# Patient Record
Sex: Female | Born: 1965 | Race: Black or African American | Hispanic: No | Marital: Single | Smoking: Never smoker
Health system: Southern US, Community
[De-identification: ages and names within clinical notes are randomized; demographics above are authoritative.]

## PROBLEM LIST (undated history)

## (undated) DIAGNOSIS — E7800 Pure hypercholesterolemia, unspecified: Secondary | ICD-10-CM

## (undated) HISTORY — PX: LAPAROSCOPIC GASTRIC SLEEVE RESECTION: SHX5895

## (undated) HISTORY — PX: BUNIONECTOMY: SHX129

---

## 1998-01-08 ENCOUNTER — Other Ambulatory Visit: Admission: RE | Admit: 1998-01-08 | Discharge: 1998-01-08 | Payer: Self-pay | Admitting: Obstetrics and Gynecology

## 1999-04-08 ENCOUNTER — Other Ambulatory Visit: Admission: RE | Admit: 1999-04-08 | Discharge: 1999-04-08 | Payer: Self-pay | Admitting: Obstetrics and Gynecology

## 2004-04-06 ENCOUNTER — Other Ambulatory Visit: Admission: RE | Admit: 2004-04-06 | Discharge: 2004-04-06 | Payer: Self-pay | Admitting: Obstetrics and Gynecology

## 2005-05-19 ENCOUNTER — Other Ambulatory Visit: Admission: RE | Admit: 2005-05-19 | Discharge: 2005-05-19 | Payer: Self-pay | Admitting: Obstetrics and Gynecology

## 2009-05-22 ENCOUNTER — Encounter: Payer: Self-pay | Admitting: Cardiology

## 2009-05-23 ENCOUNTER — Encounter: Payer: Self-pay | Admitting: Cardiology

## 2009-06-10 ENCOUNTER — Ambulatory Visit: Payer: Self-pay | Admitting: Cardiology

## 2009-06-10 DIAGNOSIS — R002 Palpitations: Secondary | ICD-10-CM | POA: Insufficient documentation

## 2009-06-10 DIAGNOSIS — R011 Cardiac murmur, unspecified: Secondary | ICD-10-CM

## 2009-06-25 ENCOUNTER — Ambulatory Visit: Payer: Self-pay

## 2009-06-25 ENCOUNTER — Encounter: Payer: Self-pay | Admitting: Cardiology

## 2009-06-25 ENCOUNTER — Ambulatory Visit: Payer: Self-pay | Admitting: Cardiology

## 2009-06-25 ENCOUNTER — Ambulatory Visit (HOSPITAL_COMMUNITY): Admission: RE | Admit: 2009-06-25 | Discharge: 2009-06-25 | Payer: Self-pay | Admitting: Cardiology

## 2010-06-09 NOTE — Consult Note (Signed)
Summary: [facility]   Imported By: Marylou Mccoy 06/26/2009 15:38:04  _____________________________________________________________________  External Attachment:    Type:   Image     Comment:   External Document

## 2010-06-09 NOTE — Assessment & Plan Note (Signed)
Summary: np6/ palps, pt has bcbs./ gd   Visit Type:  Initial Consult Primary Provider:  Tarri Fuller, MD  CC:  pvc "s.  History of Present Illness: 45 yo with history of hyperlipidemia presents for evaluation of palpitations.  In early January, patient was exercising on the ellipitical at her gym.  She noted that her heart began to race while she was exercising and got up to 169 bpm.  Afterwards, she felt cold sweats, nausea, and lightheadedness. No syncope. Heart rate was still up at rest and it took a long time for it to go back to normal.  For the next week, she would have periodic episodes (several daily) of heart "fluttering," where her heart would feel fast and irregular.  Fluttering episodes were only momentary and would then go away.  She cut out caffeine (was drinking 1-2 cups coffee daily).  After a week, the symptoms seemed to totally resolve.  She has been back on the elliptical since that time with no symptoms.  No chest pain or significant exertional dyspnea.  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    ECG: NSR, normal Labs (1/11): TSH normal, LDL 127, HDL 63, creatinine 0.6  Current Medications (verified): 1)  Vitamin D 50,000 Iu .Marland Kitchen.. 1 Tab Weekly 2)  Clartin .... Prn  Allergies (verified): 1)  ! Erythromycin 2)  ! * Pcn Family  Past History:  Past Medical History: 1. Anxiety 2. 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  Does not smoke or use illicit drugs.  Occasional ETOH. Significant stress at work.   Review of Systems       All systems reviewed and negative except as per HPI.   Vital Signs:  Patient profile:   45 year old female Height:      63 inches Weight:      223 pounds BMI:     39.65 Pulse rate:   58 /  minute BP sitting:   121 / 77  (left arm) Cuff size:   large  Vitals Entered By: Burnett Kanaris, CNA (June 10, 2009 9:51 AM)  Physical Exam  General:  Well developed, well nourished, in no acute distress. Mildly obese.  Head:  normocephalic and atraumatic Nose:  no deformity, discharge, inflammation, or lesions Mouth:  Teeth, gums and palate normal. Oral mucosa normal. Neck:  Neck supple, no JVD. No masses, thyromegaly or abnormal cervical nodes. Lungs:  Clear bilaterally to auscultation and percussion. Heart:  Non-displaced PMI, chest non-tender; regular rate and rhythm, S1, S2 without rubs or gallops. There is a 1/6 systolic murmur at the right upper sternal border.  Carotid upstroke normal, no bruit.  Pedals normal pulses. No edema, no varicosities. Abdomen:  Bowel sounds positive; abdomen soft and non-tender without masses, organomegaly, or hernias noted. No hepatosplenomegaly. Msk:  Back normal, normal gait. Muscle strength and tone normal. Extremities:  No clubbing or cyanosis. Neurologic:  Alert and oriented x 3. Skin:  Intact without lesions or rashes. Psych:  Normal affect.   Impression & Recommendations:  Problem # 1:  PALPITATIONS (ICD-785.1) Patient is actually having minimal symptoms now.  She did have the sensation of heart fluttering prior: rapid and irregular beats momentarily.  I am unable to definitively explain the episode on the  elliptical unless she was significantly dehydrated when she exercised that day.  She has cut out caffeine.  TSH was normal.  My suspicion is that she was having premature beats as the cause of the fluttering.  I will set her up for a 48 hour holter monitor.  I do not think that she needs a stress test: no chest pain, young female with no family history of premature CAD.   Problem # 2:  MURMUR (ICD-785.2) Patient has a systolic murmur.  She says she was told that it was there in childhood. Will get an echocardiogram to make sure that the  heart is structurally normal.   Other Orders: EKG w/ Interpretation (93000) Echocardiogram (Echo) Holter Monitor (Holter Monitor)  Patient Instructions: 1)  Your physician has recommended that you wear a holter monitor.  Holter monitors are medical devices that record the heart's electrical activity. Doctors most often use these monitors to diagnose arrhythmias. Arrhythmias are problems with the speed or rhythm of the heartbeat. The monitor is a small, portable device. You can wear one while you do your normal daily activities. This is usually used to diagnose what is causing palpitations/syncope (passing out). 2)  Your physician has requested that you have an echocardiogram.  Echocardiography is a painless test that uses sound waves to create images of your heart. It provides your doctor with information about the size and shape of your heart and how well your heart's chambers and valves are working.  This procedure takes approximately one hour. There are no restrictions for this procedure. 3)  Your physician recommends that you schedule a follow-up appointment as needed with Dr. Marca Ancona

## 2010-06-09 NOTE — Procedures (Signed)
Summary: SUMMARY REPORT  SUMMARY REPORT   Imported By: Mirna Mires 07/09/2009 11:37:26  _____________________________________________________________________  External Attachment:    Type:   Image     Comment:   External Document

## 2012-01-03 ENCOUNTER — Emergency Department (HOSPITAL_COMMUNITY): Payer: 59

## 2012-01-03 ENCOUNTER — Emergency Department (HOSPITAL_COMMUNITY)
Admission: EM | Admit: 2012-01-03 | Discharge: 2012-01-03 | Disposition: A | Discharge status: Home / Self Care | Payer: 59 | Attending: Emergency Medicine | Admitting: Emergency Medicine

## 2012-01-03 ENCOUNTER — Encounter (HOSPITAL_COMMUNITY): Payer: Self-pay | Admitting: Emergency Medicine

## 2012-01-03 DIAGNOSIS — R209 Unspecified disturbances of skin sensation: Secondary | ICD-10-CM | POA: Insufficient documentation

## 2012-01-03 DIAGNOSIS — R51 Headache: Secondary | ICD-10-CM | POA: Insufficient documentation

## 2012-01-03 DIAGNOSIS — R079 Chest pain, unspecified: Secondary | ICD-10-CM | POA: Insufficient documentation

## 2012-01-03 DIAGNOSIS — R202 Paresthesia of skin: Secondary | ICD-10-CM

## 2012-01-03 DIAGNOSIS — I10 Essential (primary) hypertension: Secondary | ICD-10-CM | POA: Insufficient documentation

## 2012-01-03 DIAGNOSIS — R110 Nausea: Secondary | ICD-10-CM | POA: Insufficient documentation

## 2012-01-03 LAB — URINALYSIS, ROUTINE W REFLEX MICROSCOPIC
Glucose, UA: NEGATIVE mg/dL
Hgb urine dipstick: NEGATIVE
Leukocytes, UA: NEGATIVE
Specific Gravity, Urine: 1.006 (ref 1.005–1.030)
Urobilinogen, UA: 0.2 mg/dL (ref 0.0–1.0)

## 2012-01-03 LAB — CBC WITH DIFFERENTIAL/PLATELET
Eosinophils Absolute: 0 10*3/uL (ref 0.0–0.7)
Lymphocytes Relative: 52 % — ABNORMAL HIGH (ref 12–46)
Lymphs Abs: 3.8 10*3/uL (ref 0.7–4.0)
Neutrophils Relative %: 42 % — ABNORMAL LOW (ref 43–77)
Platelets: 267 10*3/uL (ref 150–400)
RBC: 4.6 MIL/uL (ref 3.87–5.11)
WBC: 7.2 10*3/uL (ref 4.0–10.5)

## 2012-01-03 LAB — BASIC METABOLIC PANEL
CO2: 26 mEq/L (ref 19–32)
GFR calc non Af Amer: >90 mL/min (ref >90)
Glucose, Bld: 85 mg/dL (ref 70–99)
Potassium: 3.6 mEq/L (ref 3.5–5.1)
Sodium: 140 mEq/L (ref 135–145)

## 2012-01-03 LAB — POCT I-STAT, CHEM 8
HCT: 43 % (ref 36.0–46.0)
Hemoglobin: 14.6 g/dL (ref 12.0–15.0)
Sodium: 141 mEq/L (ref 135–145)
TCO2: 26 mmol/L (ref 0–100)

## 2012-01-03 MED ORDER — HYDROCHLOROTHIAZIDE 25 MG PO TABS: 25.0000 mg | ORAL_TABLET | Freq: Every day | ORAL | Status: DC

## 2012-01-03 NOTE — ED Notes (Signed)
Pt eating and drinking while waiting for dr

## 2012-01-03 NOTE — ED Notes (Addendum)
Pt reports waking this am w/high BP, (L) side numbness, (L) face numbness, headache, dizziness, and fatigue. Pt states "I just don't feel well, I'm just tired," Pt reports she was on vacation this weekend, did a lot of walking, ate a lot of food she normally does not eat, and decrease water intake this weekend. Pt denies chest/abd/back, N/V. Pt reports her BP was 156/90 while at her orthopedics office. Pt reports a hx of the same but unsure how long ago. Pt reports stress causes the facial numbness

## 2012-01-03 NOTE — ED Provider Notes (Signed)
History     CSN: [id number]  Arrival date & time 01/03/12  1125   First MD Initiated Contact with Patient 01/03/12 1513      Chief Complaint  Patient presents with  . Hypertension  . Numbness    (Consider location/radiation/quality/duration/timing/severity/associated sxs/prior treatment) Patient is a 46 y.o. female presenting with hypertension. The history is provided by the patient.  Hypertension This is a new problem. Associated symptoms include headaches. Pertinent negatives include no chest pain, no abdominal pain and no shortness of breath.   patient states that she felt a little nauseous earlier today. She states that the left side of her face was tingly at her left hand is tingly also. She's had a headache. She states that she went to the orthopedic surgeon for her bunions and her blood pressure was found to be elevated. She states his been mildly elevated before his been followed. No chest pain. No lightheadedness or dizziness. No fevers. No difficulty seen. No weakness. She states she does get tingling sometimes her left jaw when she gets stressed. She does not smoke. No fevers.  History reviewed. No pertinent past medical history.  History reviewed. No pertinent past surgical history.  History reviewed. No pertinent family history.  History  Substance Use Topics  . Smoking status: Never Smoker   . Smokeless tobacco: Not on file  . Alcohol Use: No    OB History    Grav Para Term Preterm Abortions TAB SAB Ect Mult Living                  Review of Systems  Constitutional: Negative for activity change and appetite change.  HENT: Negative for neck stiffness.   Eyes: Negative for pain.  Respiratory: Negative for chest tightness and shortness of breath.   Cardiovascular: Negative for chest pain and leg swelling.  Gastrointestinal: Positive for nausea. Negative for vomiting, abdominal pain and diarrhea.  Genitourinary: Negative for flank pain.  Musculoskeletal:  Negative for back pain.  Skin: Negative for rash.  Neurological: Positive for numbness and headaches. Negative for weakness.  Psychiatric/Behavioral: Negative for behavioral problems.    Allergies  Erythromycin; Other; and Penicillins  Home Medications   Current Outpatient Rx  Name Route Sig Dispense Refill  . VITAMIN D PO Oral Take 6,000 mg by mouth daily.    Marland Kitchen VITAMIN B 12 PO Oral Take 1 tablet by mouth daily.    Marland Kitchen OVER THE COUNTER MEDICATION Oral Take 1 tablet by mouth 2 (two) times daily. viviscal    . HYDROCHLOROTHIAZIDE 25 MG PO TABS Oral Take 1 tablet (25 mg total) by mouth daily. 10 tablet 0    BP 150/87  Pulse 68  Temp 98.3 F (36.8 C) (Oral)  Resp 18  SpO2 100%  LMP 12/20/2011  Physical Exam  Nursing note and vitals reviewed. Constitutional: She is oriented to person, place, and time. She appears well-developed and well-nourished.  HENT:  Head: Normocephalic and atraumatic.  Eyes: EOM are normal. Pupils are equal, round, and reactive to light.  Neck: Normal range of motion. Neck supple.  Cardiovascular: Regular rhythm and normal heart sounds.   No murmur heard.      bradycardia  Pulmonary/Chest: Effort normal and breath sounds normal. No respiratory distress. She has no wheezes. She has no rales.  Abdominal: Soft. Bowel sounds are normal. She exhibits no distension. There is no tenderness. There is no rebound and no guarding.  Musculoskeletal: Normal range of motion.  Neurological: She is alert and  oriented to person, place, and time. No cranial nerve deficit.  Skin: Skin is warm and dry.  Psychiatric: She has a normal mood and affect. Her speech is normal.    ED Course  Procedures (including critical care time)  Labs Reviewed  POCT I-STAT, CHEM 8 - Abnormal; Notable for the following:    Glucose, Bld 103 (*)     All other components within normal limits  CBC WITH DIFFERENTIAL - Abnormal; Notable for the following:    Neutrophils Relative 42 (*)      Lymphocytes Relative 52 (*)     All other components within normal limits  URINALYSIS, ROUTINE W REFLEX MICROSCOPIC  BASIC METABOLIC PANEL  TROPONIN I   Dg Chest 2 View  01/03/2012  *RADIOLOGY REPORT*  Clinical Data: Chest pain  CHEST - 2 VIEW  Comparison: None  Findings: The heart size and mediastinal contours are within normal limits.  Both lungs are clear.  The visualized skeletal structures are unremarkable.  IMPRESSION: Negative exam.   Original Report Authenticated By: Rosealee Albee, M.D.    Ct Head Wo Contrast  01/03/2012  *RADIOLOGY REPORT*  Clinical Data: Hypertension.  Left-sided body and face numbness. Headache and dizziness.  CT HEAD WITHOUT CONTRAST  Technique:  Contiguous axial images were obtained from the base of the skull through the vertex without contrast.  Comparison: None.  Findings: No acute intracranial abnormality is present. Specifically, there is no evidence for acute infarct, hemorrhage, mass, hydrocephalus, or extra-axial fluid collection.  The paranasal sinuses and mastoid air cells are clear.  The globes and orbits are intact.  The osseous skull is intact.  IMPRESSION: Negative CT of the head.   Original Report Authenticated By: Jamesetta Orleans. MATTERN, M.D.      1. Hypertension   2. Paresthesia      Date: 01/03/2012  Rate: 50  Rhythm: sinus bradycardia  QRS Axis: normal  Intervals: normal  ST/T Wave abnormalities: normal  Conduction Disutrbances:none  Narrative Interpretation:   Old EKG Reviewed: none available    MDM   Patient with headache left-sided paresthesia and possible chest pain. EKG is reassuring. Enzymes are negative. Head CT is negative. Patient has some mild hypertension is improved. She was started on HCTZ. I doubt cardiac or stroke cause. She'll need followup. She seen [facility] clinic and will follow with them.  Juliet Rude. Rubin Payor, MD 01/03/12 [phone]

## 2012-01-03 NOTE — ED Notes (Signed)
Pt here for htn this am; pt sts woke up from sleep with not feeling well with nausea; no neuro deficits noted; pt denies hx of htn

## 2012-01-03 NOTE — ED Notes (Signed)
MD at bedside. EDP [place]  

## 2012-01-03 NOTE — ED Notes (Signed)
Patient transported to CT 

## 2012-01-03 NOTE — ED Notes (Signed)
Palpated patients bp and it was 190.

## 2014-03-24 ENCOUNTER — Emergency Department (HOSPITAL_BASED_OUTPATIENT_CLINIC_OR_DEPARTMENT_OTHER)
Admission: EM | Admit: 2014-03-24 | Discharge: 2014-03-24 | Disposition: A | Discharge status: Home / Self Care | Payer: 59 | Attending: Emergency Medicine | Admitting: Emergency Medicine

## 2014-03-24 ENCOUNTER — Encounter (HOSPITAL_BASED_OUTPATIENT_CLINIC_OR_DEPARTMENT_OTHER): Payer: Self-pay

## 2014-03-24 ENCOUNTER — Emergency Department (HOSPITAL_BASED_OUTPATIENT_CLINIC_OR_DEPARTMENT_OTHER): Payer: 59

## 2014-03-24 DIAGNOSIS — Y998 Other external cause status: Secondary | ICD-10-CM | POA: Insufficient documentation

## 2014-03-24 DIAGNOSIS — Y929 Unspecified place or not applicable: Secondary | ICD-10-CM | POA: Insufficient documentation

## 2014-03-24 DIAGNOSIS — S93402A Sprain of unspecified ligament of left ankle, initial encounter: Secondary | ICD-10-CM

## 2014-03-24 DIAGNOSIS — Z79899 Other long term (current) drug therapy: Secondary | ICD-10-CM | POA: Diagnosis not present

## 2014-03-24 DIAGNOSIS — Z880 Allergy status to penicillin: Secondary | ICD-10-CM | POA: Insufficient documentation

## 2014-03-24 DIAGNOSIS — S99912A Unspecified injury of left ankle, initial encounter: Secondary | ICD-10-CM | POA: Diagnosis present

## 2014-03-24 DIAGNOSIS — Z8639 Personal history of other endocrine, nutritional and metabolic disease: Secondary | ICD-10-CM | POA: Insufficient documentation

## 2014-03-24 DIAGNOSIS — Y9301 Activity, walking, marching and hiking: Secondary | ICD-10-CM | POA: Diagnosis not present

## 2014-03-24 DIAGNOSIS — X58XXXA Exposure to other specified factors, initial encounter: Secondary | ICD-10-CM | POA: Diagnosis not present

## 2014-03-24 DIAGNOSIS — W19XXXA Unspecified fall, initial encounter: Secondary | ICD-10-CM

## 2014-03-24 HISTORY — DX: Pure hypercholesterolemia, unspecified: E78.00

## 2014-03-24 MED ORDER — HYDROCODONE-ACETAMINOPHEN 5-325 MG PO TABS: 1.0000 | ORAL_TABLET | Freq: Four times a day (QID) | ORAL | Status: AC | PRN

## 2014-03-24 MED ORDER — NAPROXEN 500 MG PO TABS: 500.0000 mg | ORAL_TABLET | Freq: Two times a day (BID) | ORAL | Status: AC

## 2014-03-24 NOTE — ED Notes (Signed)
Pt given ice pack

## 2014-03-24 NOTE — ED Notes (Signed)
Patient here with left ankle pain and swelling after going down inflatable slide yesterday, heard pop. Lateral swelling noted to ankle, positive distal pulses.

## 2014-03-24 NOTE — ED Provider Notes (Signed)
CSN: [id number]     Arrival date & time 03/24/14  [id number] History   First MD Initiated Contact with Patient 03/24/14 0915     Chief Complaint  Patient presents with  . Ankle Injury     (Consider location/radiation/quality/duration/timing/severity/associated sxs/prior Treatment) Patient is a 48 y.o. female presenting with lower extremity injury. The history is provided by the patient.  Ankle Injury Pertinent negatives include no chest pain, no abdominal pain, no headaches and no shortness of breath.   Patient status post injury to left ankle on a label slide yesterday at 1:00 in the afternoon. Patient was swelling to both sides of the ankle. Able to put some weight and able to walk on it some. But is using crutches currently. Patient's past history significant for bunion surgery on both feet.  Patient states that the pain in the ankle is minimal may be a 2 out of 10. Denies any other injuries. Past Medical History  Diagnosis Date  . High cholesterol    History reviewed. No pertinent past surgical history. No family history on file. History  Substance Use Topics  . Smoking status: Never Smoker   . Smokeless tobacco: Not on file  . Alcohol Use: No   OB History    No data available     Review of Systems  Constitutional: Negative for fever.  HENT: Negative for congestion.   Eyes: Negative for redness.  Respiratory: Negative for shortness of breath.   Cardiovascular: Negative for chest pain.  Gastrointestinal: Negative for abdominal pain.  Genitourinary: Negative for dysuria.  Musculoskeletal: Positive for joint swelling. Negative for back pain and neck pain.  Skin: Negative for rash.  Neurological: Negative for tremors, numbness and headaches.  Psychiatric/Behavioral: Negative for confusion.      Allergies  Erythromycin; Other; and Penicillins  Home Medications   Prior to Admission medications   Medication Sig Start Date End Date Taking? Authorizing Provider   Cholecalciferol (VITAMIN D PO) Take 6,000 mg by mouth daily.   Yes Historical Provider, MD  Cyanocobalamin (VITAMIN B 12 PO) Take 1 tablet by mouth daily.   Yes Historical Provider, MD  HYDROcodone-acetaminophen (NORCO/VICODIN) 5-325 MG per tablet Take 1-2 tablets by mouth every 6 (six) hours as needed for moderate pain. 03/24/14   Vanetta MuldersScott Srinidhi Landers, MD  naproxen (NAPROSYN) 500 MG tablet Take 1 tablet (500 mg total) by mouth 2 (two) times daily. 03/24/14   Vanetta MuldersScott Emmakate Hypes, MD  OVER THE COUNTER MEDICATION Take 1 tablet by mouth 2 (two) times daily. viviscal    Historical Provider, MD   BP 174/84 mmHg  Pulse 70  Temp(Src) 98.2 F (36.8 C) (Oral)  Resp 16  Ht 5\' 3"  (1.6 m)  Wt 225 lb (102.059 kg)  BMI 39.87 kg/m2  SpO2 97% Physical Exam  Constitutional: She is oriented to person, place, and time. She appears well-developed and well-nourished. No distress.  HENT:  Head: Normocephalic and atraumatic.  Mouth/Throat: Oropharynx is clear and moist.  Eyes: Conjunctivae and EOM are normal. Pupils are equal, round, and reactive to light.  Neck: Normal range of motion. Neck supple.  Cardiovascular: Normal rate, regular rhythm and intact distal pulses.   No murmur heard. Pulmonary/Chest: Effort normal and breath sounds normal. No respiratory distress.  Abdominal: Soft. Bowel sounds are normal. There is no tenderness.  Musculoskeletal: Normal range of motion. She exhibits edema and tenderness.  Swelling to bilateral left ankle. Cap refill to great toe is 1 second. Dorsalis pedis pulses palpable at the 1+. Sensation intact.  No proximal leg tenderness. No knee tenderness. No knee swelling.  Both feet with well-healed bunion of surgical scars.  Neurological: She is alert and oriented to person, place, and time. No cranial nerve deficit. She exhibits normal muscle tone. Coordination normal.  Skin: No rash noted.    ED Course  Procedures (including critical care time) Labs Review Labs Reviewed - No  data to display  Imaging Review Dg Ankle Complete Left  03/24/2014   CLINICAL DATA:  Left ankle pain and swelling. Injury sustained on an inflatable slide yesterday.  EXAM: LEFT ANKLE COMPLETE - 3+ VIEW  COMPARISON:  None  FINDINGS: Diffuse soft tissue swelling about the ankle. Small well corticated ossified bodies inferior to the medial malleolus may represent the sequelae of remote injury or accessory ossification centers. The ankle mortise appears congruent. The talar dome is intact. No ankle joint effusion. Normal bony mineralization without evidence of the lytic or blastic osseous lesion. Incompletely imaged surgical change in the great toe metatarsal.  IMPRESSION: 1. Diffuse soft tissue swelling about the ankle without evidence of acute fracture or malalignment. 2. Incompletely imaged surgical changes in the great toe metatarsal. Query prior bunion repair surgery?   Electronically Signed   By: Malachy MoanHeath  McCullough M.D.   On: 03/24/2014 09:54     EKG Interpretation None      MDM   Final diagnoses:  Ankle sprain, left, initial encounter    X-rays of the left ankle are negative for any bony injury. Discussed consistent with ankle sprain. Patient is able to weight-bear on the left ankle and ambulate some. Patient also has crutches as needed. Patient provided ASO. Patient will elevate and ice today has follow-up with sports medicine as needed. Treated with Naprosyn and hydrocodone. No other injuries.  Vanetta MuldersScott Mikai Meints, MD 03/24/14 1032

## 2014-03-24 NOTE — Discharge Instructions (Signed)
Ankle Sprain An ankle sprain is an injury to the strong, fibrous tissues (ligaments) that hold your ankle bones together.  HOME CARE   Put ice on your ankle for 1-2 days or as told by your doctor.  Put ice in a plastic bag.  Place a towel between your skin and the bag.  Leave the ice on for 15-20 minutes at a time, every 2 hours while you are awake.  Only take medicine as told by your doctor.  Raise (elevate) your injured ankle above the level of your heart as much as possible for 2-3 days.  Use crutches if your doctor tells you to. Slowly put your own weight on the affected ankle. Use the crutches until you can walk without pain.  If you have a plaster splint:  Do not rest it on anything harder than a pillow for 24 hours.  Do not put weight on it.  Do not get it wet.  Take it off to shower or bathe.  If given, use an elastic wrap or support stocking for support. Take the wrap off if your toes lose feeling (numb), tingle, or turn cold or blue.  If you have an air splint:  Add or let out air to make it comfortable.  Take it off at night and to shower and bathe.  Wiggle your toes and move your ankle up and down often while you are wearing it. GET HELP IF:  You have rapidly increasing bruising or puffiness (swelling).  Your toes feel very cold.  You lose feeling in your foot.  Your medicine does not help your pain. GET HELP RIGHT AWAY IF:   Your toes lose feeling (numb) or turn blue.  You have severe pain that is increasing. MAKE SURE YOU:   Understand these instructions.  Will watch your condition.  Will get help right away if you are not doing well or get worse. Document Released: 10/13/2007 Document Revised: 09/10/2013 Document Reviewed: 11/08/2011 [facility] Patient Information 2015 [employer] [place], [place]. This information is not intended to replace advice given to you by your health care provider. Make sure you discuss any questions you have with your health care  provider.  Use the ASO wrap. Since x-rays are negative for fracture okay to weight-bear on the left ankle and to walk on it. If too uncomfortable usual crutches. Make an appointment to follow-up with sports medicine. Take the Naprosyn on a regular basis. Take the hydrocodone as needed for more severe pain. Try to keep the left ankle elevated as much as possible for the next 2 days. Ice applied today throughout the day would be beneficial.

## 2014-03-25 ENCOUNTER — Encounter: Payer: Self-pay | Admitting: Family Medicine

## 2014-03-25 ENCOUNTER — Ambulatory Visit (INDEPENDENT_AMBULATORY_CARE_PROVIDER_SITE_OTHER): Payer: 59 | Admitting: Family Medicine

## 2014-03-25 VITALS — BP 168/88 | HR 58 | Ht 63.0 in | Wt 225.0 lb

## 2014-03-25 DIAGNOSIS — S99912A Unspecified injury of left ankle, initial encounter: Secondary | ICD-10-CM

## 2014-03-25 NOTE — Patient Instructions (Signed)
You have an ankle sprain. Ice the area for 15 minutes at a time, 3-4 times a day Aleve 2 tabs twice a day with food OR ibuprofen 3 tabs three times a day with food for pain and inflammation for 7-10 days then as needed. Elevate above the level of your heart when possible Bear weight when tolerated Use laceup ankle brace to help with stability while you recover from this injury (typically used for 6 weeks). Come out of the boot/brace twice a day to do Up/down and alphabet exercises 2-3 sets of each. Start theraband strengthening exercises in 1-2 weeks - once a day 3 sets of 10. Consider physical therapy for strengthening and balance exercises. If not improving as expected, we may repeat x-rays or consider further testing like an MRI. Activities as tolerated - when not limping and pain is less than 3 on a scale of 1-10 ok to do weight bearing exercises and walking for exercise. Follow up with me in 2-3 weeks.

## 2014-03-26 ENCOUNTER — Encounter: Payer: Self-pay | Admitting: Family Medicine

## 2014-03-26 DIAGNOSIS — S99912A Unspecified injury of left ankle, initial encounter: Secondary | ICD-10-CM | POA: Insufficient documentation

## 2014-03-26 NOTE — Assessment & Plan Note (Signed)
radiographs negative.  2/2 lateral ankle sprain.  Icing, nsaids, elevation.  ASo.  HEP reviewed including theraband exercises to start in 1-2 weeks.  Consider physical therapy.  Activities as tolerated.  F/u in 2-3 weeks.

## 2014-03-26 NOTE — Progress Notes (Signed)
PCP: No PCP Per Patient  Subjective:   HPI: Patient is a 48 y.o. female here for left ankle pain.  Patient reports on 11/14 during a fun run she came down an inflatable slide (one of the obstacles) and inverted her left ankle. Felt a crunch here laterally. Continued with the race. + swelling. Tried ibuprofen. Radiographs in ED negative. Using ASO. Had an injury to one of her ankles remotely but not sure which one.  Past Medical History  Diagnosis Date  . High cholesterol     Current Outpatient Prescriptions on File Prior to Visit  Medication Sig Dispense Refill  . Cholecalciferol (VITAMIN D PO) Take 6,000 mg by mouth daily.    . Cyanocobalamin (VITAMIN B 12 PO) Take 1 tablet by mouth daily.    Marland Kitchen. HYDROcodone-acetaminophen (NORCO/VICODIN) 5-325 MG per tablet Take 1-2 tablets by mouth every 6 (six) hours as needed for moderate pain. 10 tablet 0  . naproxen (NAPROSYN) 500 MG tablet Take 1 tablet (500 mg total) by mouth 2 (two) times daily. 14 tablet 0  . OVER THE COUNTER MEDICATION Take 1 tablet by mouth 2 (two) times daily. viviscal     No current facility-administered medications on file prior to visit.    No past surgical history on file.  Allergies  Allergen Reactions  . Erythromycin Swelling    To face  . Other Other (See Comments)    NUTS  Scratchy throat  . Penicillins Swelling    To face    History   Social History  . Marital Status: Single    Spouse Name: N/A    Number of Children: N/A  . Years of Education: N/A   Occupational History  . Not on file.   Social History Main Topics  . Smoking status: Never Smoker   . Smokeless tobacco: Not on file  . Alcohol Use: No  . Drug Use: No  . Sexual Activity: Not on file   Other Topics Concern  . Not on file   Social History Narrative    No family history on file.  BP 168/88 mmHg  Pulse 58  Ht 5\' 3"  (1.6 m)  Wt 225 lb (102.059 kg)  BMI 39.87 kg/m2  Review of Systems: See HPI above.     Objective:  Physical Exam:  Gen: NAD  Left ankle/foot: Mod lateral ankle swelling.  No bruising, other deformity. Mild limitation ROM all directions. TTP greatest ATFL, anterior ankle joint, lateral malleolus.  No other foot/ankle tenderness. Painful 1+ ant drawer and talar tilt.   Negative syndesmotic compression. Thompsons test negative. NV intact distally.    Assessment & Plan:  1. Left ankle injury - radiographs negative.  2/2 lateral ankle sprain.  Icing, nsaids, elevation.  ASo.  HEP reviewed including theraband exercises to start in 1-2 weeks.  Consider physical therapy.  Activities as tolerated.  F/u in 2-3 weeks.

## 2014-04-12 ENCOUNTER — Ambulatory Visit (INDEPENDENT_AMBULATORY_CARE_PROVIDER_SITE_OTHER): Payer: 59 | Admitting: Family Medicine

## 2014-04-12 ENCOUNTER — Encounter: Payer: Self-pay | Admitting: Family Medicine

## 2014-04-12 VITALS — BP 156/93 | HR 59 | Ht 63.0 in | Wt 224.0 lb

## 2014-04-12 DIAGNOSIS — S99912D Unspecified injury of left ankle, subsequent encounter: Secondary | ICD-10-CM

## 2014-04-12 NOTE — Patient Instructions (Signed)
Continue your exercises for 4 more weeks most days of the week. Wear brace at this point when going to be walking a lot or on irregular surfaces (like grass, fields, hills) for 4 more weeks. Icing, ibuprofen if needed. Follow up with me in 4 weeks or as needed.

## 2014-04-16 NOTE — Progress Notes (Signed)
PCP: No PCP Per Patient  Subjective:   HPI: Patient is a 48 y.o. female here for left ankle pain.  11/16: Patient reports on 11/14 during a fun run she came down an inflatable slide (one of the obstacles) and inverted her left ankle. Felt a crunch here laterally. Continued with the race. + swelling. Tried ibuprofen. Radiographs in ED negative. Using ASO. Had an injury to one of her ankles remotely but not sure which one.  12/4: Patient reports she's doing pretty well. Some swelling. Some pain today at 2/10. Wearing brace. Doing home exercises.  Past Medical History  Diagnosis Date  . High cholesterol     Current Outpatient Prescriptions on File Prior to Visit  Medication Sig Dispense Refill  . Cholecalciferol (VITAMIN D PO) Take 6,000 mg by mouth daily.    . Cyanocobalamin (VITAMIN B 12 PO) Take 1 tablet by mouth daily.    Marland Kitchen. HYDROcodone-acetaminophen (NORCO/VICODIN) 5-325 MG per tablet Take 1-2 tablets by mouth every 6 (six) hours as needed for moderate pain. 10 tablet 0  . naproxen (NAPROSYN) 500 MG tablet Take 1 tablet (500 mg total) by mouth 2 (two) times daily. 14 tablet 0  . OVER THE COUNTER MEDICATION Take 1 tablet by mouth 2 (two) times daily. viviscal     No current facility-administered medications on file prior to visit.    Past Surgical History  Procedure Laterality Date  . Bunionectomy      Allergies  Allergen Reactions  . Erythromycin Swelling    To face  . Other Other (See Comments)    NUTS  Scratchy throat  . Penicillins Swelling    To face    History   Social History  . Marital Status: Single    Spouse Name: N/A    Number of Children: N/A  . Years of Education: N/A   Occupational History  . Not on file.   Social History Main Topics  . Smoking status: Never Smoker   . Smokeless tobacco: Not on file  . Alcohol Use: No  . Drug Use: No  . Sexual Activity: Not on file   Other Topics Concern  . Not on file   Social History  Narrative    No family history on file.  BP 156/93 mmHg  Pulse 59  Ht 5\' 3"  (1.6 m)  Wt 224 lb (101.606 kg)  BMI 39.69 kg/m2  Review of Systems: See HPI above.    Objective:  Physical Exam:  Gen: NAD  Left ankle/foot: Minimal lateral ankle swelling.  No bruising, other deformity. FROM. TTP greatest ATFL, anterior ankle joint. No malleolar, other foot/ankle tenderness. 1+ ant drawer and talar tilt.   Negative syndesmotic compression. Thompsons test negative. NV intact distally.    Assessment & Plan:  1. Left ankle injury - radiographs negative.  2/2 lateral ankle sprain.  Icing, nsaids, elevation.  Continue ASO on long walks or standing, irregular surfaces.  Continue HEP for 4 more weeks.  F/u with me at that time or prn.

## 2014-04-16 NOTE — Assessment & Plan Note (Signed)
radiographs negative.  2/2 lateral ankle sprain.  Icing, nsaids, elevation.  Continue ASO on long walks or standing, irregular surfaces.  Continue HEP for 4 more weeks.  F/u with me at that time or prn.

## 2016-01-22 ENCOUNTER — Other Ambulatory Visit: Payer: Self-pay | Admitting: Bariatrics

## 2016-01-27 ENCOUNTER — Ambulatory Visit: Payer: Self-pay

## 2016-02-05 ENCOUNTER — Ambulatory Visit
Admission: RE | Admit: 2016-02-05 | Discharge: 2016-02-05 | Disposition: A | Discharge status: Home / Self Care | Payer: BLUE CROSS/BLUE SHIELD | Source: Ambulatory Visit | Attending: Bariatrics | Admitting: Bariatrics

## 2016-02-10 ENCOUNTER — Other Ambulatory Visit (HOSPITAL_COMMUNITY): Payer: Self-pay | Admitting: Bariatrics

## 2016-02-10 DIAGNOSIS — K3184 Gastroparesis: Secondary | ICD-10-CM

## 2016-02-16 ENCOUNTER — Encounter (HOSPITAL_COMMUNITY)
Admission: RE | Admit: 2016-02-16 | Discharge: 2016-02-16 | Disposition: A | Discharge status: Home / Self Care | Payer: BLUE CROSS/BLUE SHIELD | Source: Ambulatory Visit | Attending: Bariatrics | Admitting: Bariatrics

## 2016-02-16 DIAGNOSIS — K3184 Gastroparesis: Secondary | ICD-10-CM | POA: Insufficient documentation

## 2016-02-16 MED ORDER — TECHNETIUM TC 99M SULFUR COLLOID
2.0000 | Freq: Once | INTRAVENOUS | Status: AC | PRN
  Administered 2016-02-16: 2 via ORAL

## 2018-06-12 ENCOUNTER — Emergency Department (HOSPITAL_COMMUNITY)
Admission: EM | Admit: 2018-06-12 | Discharge: 2018-06-13 | Disposition: A | Discharge status: Home / Self Care | Payer: BLUE CROSS/BLUE SHIELD | Attending: Emergency Medicine | Admitting: Emergency Medicine

## 2018-06-12 ENCOUNTER — Other Ambulatory Visit: Payer: Self-pay

## 2018-06-12 ENCOUNTER — Encounter (HOSPITAL_COMMUNITY): Payer: Self-pay | Admitting: *Deleted

## 2018-06-12 DIAGNOSIS — R5383 Other fatigue: Secondary | ICD-10-CM

## 2018-06-12 DIAGNOSIS — Z79899 Other long term (current) drug therapy: Secondary | ICD-10-CM | POA: Diagnosis not present

## 2018-06-12 DIAGNOSIS — I10 Essential (primary) hypertension: Secondary | ICD-10-CM | POA: Insufficient documentation

## 2018-06-12 LAB — I-STAT BETA HCG BLOOD, ED (MC, WL, AP ONLY)

## 2018-06-12 LAB — COMPREHENSIVE METABOLIC PANEL
ALK PHOS: 44 U/L (ref 38–126)
ALT: 27 U/L (ref 0–44)
ANION GAP: 11 (ref 5–15)
AST: 32 U/L (ref 15–41)
Albumin: 3.8 g/dL (ref 3.5–5.0)
BILIRUBIN TOTAL: 0.3 mg/dL (ref 0.3–1.2)
BUN: 5 mg/dL — ABNORMAL LOW (ref 6–20)
CALCIUM: 9 mg/dL (ref 8.9–10.3)
CO2: 28 mmol/L (ref 22–32)
Chloride: 102 mmol/L (ref 98–111)
Creatinine, Ser: 0.64 mg/dL (ref 0.44–1.00)
GFR calc non Af Amer: >60 mL/min (ref >60)
Glucose, Bld: 100 mg/dL — ABNORMAL HIGH (ref 70–99)
Potassium: 4.3 mmol/L (ref 3.5–5.1)
Sodium: 141 mmol/L (ref 135–145)
TOTAL PROTEIN: 6.9 g/dL (ref 6.5–8.1)

## 2018-06-12 LAB — CBC
HCT: 42.1 % (ref 36.0–46.0)
Hemoglobin: 13.5 g/dL (ref 12.0–15.0)
MCH: 29.8 pg (ref 26.0–34.0)
MCHC: 32.1 g/dL (ref 30.0–36.0)
MCV: 92.9 fL (ref 80.0–100.0)
NRBC: 0 % (ref 0.0–0.2)
PLATELETS: 257 10*3/uL (ref 150–400)
RBC: 4.53 MIL/uL (ref 3.87–5.11)
RDW: 13.4 % (ref 11.5–15.5)
WBC: 4.7 10*3/uL (ref 4.0–10.5)

## 2018-06-12 LAB — LIPASE, BLOOD: Lipase: 39 U/L (ref 11–51)

## 2018-06-12 MED ORDER — SODIUM CHLORIDE 0.9% FLUSH
3.0000 mL | Freq: Once | INTRAVENOUS | Status: AC
  Administered 2018-06-13: 3 mL via INTRAVENOUS

## 2018-06-12 NOTE — ED Triage Notes (Signed)
Pt reports recent flu symptoms and decreased appetite. Has hx of gastric sleeve surgery and now feels fatigued and dehydrated. No acute distress is noted at this time.

## 2018-06-13 LAB — URINALYSIS, ROUTINE W REFLEX MICROSCOPIC
Bilirubin Urine: NEGATIVE
Glucose, UA: NEGATIVE mg/dL
Hgb urine dipstick: NEGATIVE
KETONES UR: NEGATIVE mg/dL
LEUKOCYTES UA: NEGATIVE
NITRITE: NEGATIVE
PH: 6 (ref 5.0–8.0)
PROTEIN: NEGATIVE mg/dL
Specific Gravity, Urine: 1.005 (ref 1.005–1.030)

## 2018-06-13 MED ORDER — SODIUM CHLORIDE 0.9 % IV BOLUS
1000.0000 mL | Freq: Once | INTRAVENOUS | Status: AC
  Administered 2018-06-13: 1000 mL via INTRAVENOUS

## 2018-06-13 MED ORDER — AMLODIPINE BESYLATE 5 MG PO TABS: 5.0000 mg | ORAL_TABLET | Freq: Every day | ORAL | 1 refills | Status: AC

## 2018-06-13 NOTE — ED Provider Notes (Signed)
[facility]   CSN: [id number] Arrival date & time: 06/12/18  1701     History   Chief Complaint Chief Complaint  Patient presents with  . Fatigue  . Weakness    HPI Samantha Harrell is a 53 y.o. female.  53 year old female with a history of dyslipidemia presents to the emergency department for evaluation of suspected dehydration and fatigue.  She states that she has felt increasingly fatigued since a febrile illness which began a few days ago.  States that her last recorded temperature was on Sunday with maximum of 102F.  She has been afebrile since Sunday and has been managing her symptoms with essential oils and other homeopathic remedies.  Associated symptoms with fever have included nasal congestion as well as cough.  Patient reports that she is unable to orally hydrate aggressively due to her prior gastric sleeve surgery.  She feels that this has made her gradually dehydrated.  She denies any nausea, vomiting, diarrhea, abdominal pain.  No chest pain or shortness of breath.  The history is provided by the patient. No language interpreter was used.  Weakness    Past Medical History:  Diagnosis Date  . High cholesterol     Patient Active Problem List   Diagnosis Date Noted  . Left ankle injury 03/26/2014  . PALPITATIONS 06/10/2009  . MURMUR 06/10/2009    Past Surgical History:  Procedure Laterality Date  . BUNIONECTOMY    . LAPAROSCOPIC GASTRIC SLEEVE RESECTION       OB History   No obstetric history on file.      Home Medications    Prior to Admission medications   Medication Sig Start Date End Date Taking? Authorizing Provider  amLODipine (NORVASC) 5 MG tablet Take 1 tablet (5 mg total) by mouth daily. 06/13/18   Antony MaduraHumes, Yachet Mattson, PA-C  Cholecalciferol (VITAMIN D PO) Take 6,000 mg by mouth daily.    [provider]  Cyanocobalamin (VITAMIN B 12 PO) Take 1 tablet by mouth daily.    [provider]  HYDROcodone-acetaminophen (NORCO/VICODIN) 5-325 MG per tablet Take 1-2 tablets by mouth every 6 (six) hours as needed for moderate pain. 03/24/14   Vanetta MuldersZackowski, Scott, MD  naproxen (NAPROSYN) 500 MG tablet Take 1 tablet (500 mg total) by mouth 2 (two) times daily. 03/24/14   Vanetta MuldersZackowski, Scott, MD  OVER THE COUNTER MEDICATION Take 1 tablet by mouth 2 (two) times daily. viviscal    [provider]    Family History History reviewed. No pertinent family history.  Social History Social History   Tobacco Use  . Smoking status: Never Smoker  Substance Use Topics  . Alcohol use: No    Alcohol/week: 0.0 standard drinks  . Drug use: No     Allergies   Erythromycin; Other; and Penicillins   Review of Systems Review of Systems  Neurological: Positive for weakness.  Ten systems reviewed and are negative for acute change, except as noted in the HPI.    Physical Exam Updated Vital Signs BP (!) 168/90 (BP Location: Left Arm)   Pulse (!) 57   Temp (!) 97.5 F (36.4 C) (Oral)   Resp 16   LMP 05/22/2018   SpO2 100%   Physical Exam Vitals signs and nursing note reviewed.  Constitutional:      General: She is not in acute distress.    Appearance: She is well-developed. She is not diaphoretic.     Comments: Nontoxic appearing and in  NAD  HENT:     Head: Normocephalic and atraumatic.  Eyes:     General: No scleral icterus.    Conjunctiva/sclera: Conjunctivae normal.  Neck:     Musculoskeletal: Normal range of motion.  Cardiovascular:     Rate and Rhythm: Normal rate and regular rhythm.     Pulses: Normal pulses.  Pulmonary:     Effort: Pulmonary effort is normal. No respiratory distress.     Breath sounds: No stridor. No wheezing or rales.     Comments: Sporadic, dry cough.  Lungs grossly clear bilaterally. Musculoskeletal: Normal range of motion.  Skin:    General: Skin is warm and dry.     Coloration: Skin is not pale.     Findings: No erythema or rash.    Neurological:     Mental Status: She is alert and oriented to person, place, and time.  Psychiatric:        Behavior: Behavior normal.      ED Treatments / Results  Labs (all labs ordered are listed, but only abnormal results are displayed) Labs Reviewed  COMPREHENSIVE METABOLIC PANEL - Abnormal; Notable for the following components:      Result Value   Glucose, Bld 100 (*)    BUN 5 (*)    All other components within normal limits  LIPASE, BLOOD  CBC  URINALYSIS, ROUTINE W REFLEX MICROSCOPIC  I-STAT BETA HCG BLOOD, ED ([facility], [facility], [facility] ONLY)    EKG EKG Interpretation  Date/Time:  Tuesday June 13 2018 02:38:50 EST Ventricular Rate:  55 PR Interval:    QRS Duration: 89 QT Interval:  468 QTC Calculation: 448 R Axis:   49 Text Interpretation:  Sinus rhythm No significant change since last tracing Confirmed by Rochele Raring [phone]) on 06/13/2018 2:44:33 AM   Radiology No results found.  Procedures Procedures (including critical care time)  Medications Ordered in ED Medications  sodium chloride flush (NS) 0.9 % injection 3 mL (3 mLs Intravenous Given 06/13/18 0234)  sodium chloride 0.9 % bolus 1,000 mL (0 mLs Intravenous Stopped 06/13/18 0343)    1:58 AM On chart review, patient noted to be hypertensive at many multiple ED visits as well as outpatient bariatric appointments.  She reports a family history of high blood pressure in her mother.  States that she is very diet conscious, especially since her gastric sleeve.  She was previously on 25 mg hydrochlorothiazide, but felt that it dropped her blood pressure too low.  She is not actively followed by a PCP.  Discussed the need to restart antihypertensives.  Patient verbalizes understanding.  Plan for IV fluid hydration.  Will reassess.  4:00 AM Patient feels much better following IV fluids.  She has been ambulatory to the bathroom without difficulty.  Blood pressure has improved down to 168/90 without intervention.  Plan to  proceed with discharge on Norvasc.   Initial Impression / Assessment and Plan / ED Course  I have reviewed the triage vital signs and the nursing notes.  Pertinent labs & imaging results that were available during my care of the patient were reviewed by me and considered in my medical decision making (see chart for details).     53 year old female presents to the emergency department for complaints of fatigue in the setting of recent febrile illness, likely viral etiology.  Reports inability to aggressively hydrate due to prior gastric sleeve.  Her laboratory evaluation is reassuring today.  EKG shows normal sinus rhythm.  The patient has had symptomatic  improvement following 1 L of IV fluids.  She was noted to be hypertensive while in the ED which she has a history of dating back to at least 2013.  Reports previously being on hydrochlorothiazide for blood pressure, but felt it dropped her blood pressure too low.  Has been off of antihypertensives for several years.  Will start on 5 mg Norvasc daily.  I have encouraged blood pressure recheck within the week as well as follow-up with a primary care doctor.  Return precautions discussed and provided.  Patient discharged in stable condition with no unaddressed concerns.   Final Clinical Impressions(s) / ED Diagnoses   Final diagnoses:  Fatigue, unspecified type  Essential hypertension    ED Discharge Orders         Ordered    amLODipine (NORVASC) 5 MG tablet  Daily     06/13/18 0400           Antony MaduraHumes, Seymour Pavlak, PA-C 06/13/18 0451    Ward, Layla MawKristen N, DO 06/13/18 [phone]

## 2018-06-13 NOTE — Discharge Instructions (Signed)
Your blood work today was reassuring.  You were found to have high blood pressure.  This should be rechecked by a primary care doctor in 1 week.  Given your history of high blood pressure, you have been started on Norvasc daily.  Take as prescribed.  Try to limit your salt intake as well as your consumption of processed foods.  Return to the ED for new or concerning symptoms.

## 2018-08-25 IMAGING — NM NM GASTRIC EMPTYING
3 series · 3 of 3 positions shown · non-contrast
Comparison: Upper GI series of February 05, 2016

CLINICAL DATA: Chronic mid abdominal pain, occasional reflux,
preoperative evaluation prior to gastric sleeve procedure.

EXAM:
NUCLEAR MEDICINE GASTRIC EMPTYING SCAN
TECHNIQUE: After oral ingestion of radiolabeled meal, sequential abdominal
images were obtained for 120 minutes. Residual percentage of
activity remaining within the stomach was calculated at 60 and 120
minutes.
RADIOPHARMACEUTICALS:  2.2 mCi 8c-AAm sulfur colloid in standardized
meal

[Series 1: 2 hr · 4.14mm/px · 1 of 1 slices shown]
[im 1/1]
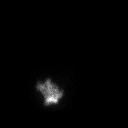

[Series 1: 0 min · 4.14mm/px · 1 of 1 slices shown]
[im 1/1]
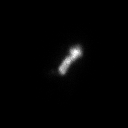

[Series 2: 1 hr · 4.14mm/px · 1 of 1 slices shown]
[im 1/1]
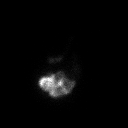

[3 of 3 positions shown; findings below may reference images not displayed]

FINDINGS: Expected location of the stomach in the left upper quadrant.
Ingested meal empties the stomach gradually over the course of the
study with 17% retention at 60 min and 1% retention at 120 min
(normal retention less than 30% at a 120 min).
IMPRESSION: Normal gastric emptying study.
